# Patient Record
Sex: Female | Born: 1975 | Race: White | Hispanic: No | Marital: Married
Health system: Southern US, Community
[De-identification: ages and names within clinical notes are randomized; demographics above are authoritative.]

## PROBLEM LIST (undated history)

## (undated) DIAGNOSIS — I2699 Other pulmonary embolism without acute cor pulmonale: Secondary | ICD-10-CM

## (undated) DIAGNOSIS — F339 Major depressive disorder, recurrent, unspecified: Secondary | ICD-10-CM

## (undated) DIAGNOSIS — D6859 Other primary thrombophilia: Secondary | ICD-10-CM

## (undated) DIAGNOSIS — I639 Cerebral infarction, unspecified: Secondary | ICD-10-CM

## (undated) DIAGNOSIS — F419 Anxiety disorder, unspecified: Secondary | ICD-10-CM

---

## 2016-05-28 DIAGNOSIS — I639 Cerebral infarction, unspecified: Secondary | ICD-10-CM

## 2016-05-28 HISTORY — DX: Cerebral infarction, unspecified: I63.9

## 2017-08-09 ENCOUNTER — Encounter (HOSPITAL_COMMUNITY): Payer: Self-pay | Admitting: Emergency Medicine

## 2017-08-09 ENCOUNTER — Emergency Department (HOSPITAL_COMMUNITY): Payer: 59

## 2017-08-09 ENCOUNTER — Emergency Department (HOSPITAL_COMMUNITY)
Admission: EM | Admit: 2017-08-09 | Discharge: 2017-08-09 | Disposition: A | Discharge status: Home / Self Care | Payer: 59 | Attending: Emergency Medicine | Admitting: Emergency Medicine

## 2017-08-09 DIAGNOSIS — D6859 Other primary thrombophilia: Secondary | ICD-10-CM | POA: Insufficient documentation

## 2017-08-09 DIAGNOSIS — Z7901 Long term (current) use of anticoagulants: Secondary | ICD-10-CM | POA: Diagnosis not present

## 2017-08-09 DIAGNOSIS — Z8673 Personal history of transient ischemic attack (TIA), and cerebral infarction without residual deficits: Secondary | ICD-10-CM | POA: Diagnosis not present

## 2017-08-09 DIAGNOSIS — Z79899 Other long term (current) drug therapy: Secondary | ICD-10-CM | POA: Diagnosis not present

## 2017-08-09 DIAGNOSIS — R531 Weakness: Secondary | ICD-10-CM | POA: Insufficient documentation

## 2017-08-09 DIAGNOSIS — F339 Major depressive disorder, recurrent, unspecified: Secondary | ICD-10-CM | POA: Insufficient documentation

## 2017-08-09 DIAGNOSIS — Z86711 Personal history of pulmonary embolism: Secondary | ICD-10-CM | POA: Diagnosis not present

## 2017-08-09 DIAGNOSIS — F419 Anxiety disorder, unspecified: Secondary | ICD-10-CM | POA: Insufficient documentation

## 2017-08-09 DIAGNOSIS — R202 Paresthesia of skin: Secondary | ICD-10-CM | POA: Diagnosis not present

## 2017-08-09 DIAGNOSIS — I2699 Other pulmonary embolism without acute cor pulmonale: Secondary | ICD-10-CM | POA: Insufficient documentation

## 2017-08-09 HISTORY — DX: Anxiety disorder, unspecified: F41.9

## 2017-08-09 HISTORY — DX: Other primary thrombophilia: D68.59

## 2017-08-09 HISTORY — DX: Other pulmonary embolism without acute cor pulmonale: I26.99

## 2017-08-09 HISTORY — DX: Cerebral infarction, unspecified: I63.9

## 2017-08-09 HISTORY — DX: Major depressive disorder, recurrent, unspecified: F33.9

## 2017-08-09 LAB — I-STAT CHEM 8, ED
BUN: 5 mg/dL — ABNORMAL LOW (ref 6–20)
Calcium, Ion: 1.17 mmol/L (ref 1.15–1.40)
Chloride: 106 mmol/L (ref 101–111)
Creatinine, Ser: 0.7 mg/dL (ref 0.44–1.00)
Glucose, Bld: 112 mg/dL — ABNORMAL HIGH (ref 65–99)
HCT: 44 % (ref 36.0–46.0)
Hemoglobin: 15 g/dL (ref 12.0–15.0)
Potassium: 3.8 mmol/L (ref 3.5–5.1)
Sodium: 142 mmol/L (ref 135–145)
TCO2: 23 mmol/L (ref 22–32)

## 2017-08-09 LAB — RAPID URINE DRUG SCREEN, HOSP PERFORMED
Amphetamines: NOT DETECTED
Barbiturates: NOT DETECTED
Benzodiazepines: POSITIVE — AB
Cocaine: NOT DETECTED
Opiates: NOT DETECTED
Tetrahydrocannabinol: NOT DETECTED

## 2017-08-09 LAB — APTT: aPTT: 30 seconds (ref 24–36)

## 2017-08-09 LAB — URINALYSIS, ROUTINE W REFLEX MICROSCOPIC
Bilirubin Urine: NEGATIVE
Glucose, UA: NEGATIVE mg/dL
Hgb urine dipstick: NEGATIVE
Ketones, ur: NEGATIVE mg/dL
Leukocytes, UA: NEGATIVE
Nitrite: NEGATIVE
Protein, ur: NEGATIVE mg/dL
Specific Gravity, Urine: 1.005 (ref 1.005–1.030)
pH: 7 (ref 5.0–8.0)

## 2017-08-09 LAB — DIFFERENTIAL
Basophils Absolute: 0 10*3/uL (ref 0.0–0.1)
Basophils Relative: 0 %
Eosinophils Absolute: 0 10*3/uL (ref 0.0–0.7)
Eosinophils Relative: 1 %
Lymphocytes Relative: 39 %
Lymphs Abs: 2.3 10*3/uL (ref 0.7–4.0)
Monocytes Absolute: 0.3 10*3/uL (ref 0.1–1.0)
Monocytes Relative: 4 %
Neutro Abs: 3.3 10*3/uL (ref 1.7–7.7)
Neutrophils Relative %: 56 %

## 2017-08-09 LAB — PROTIME-INR
INR: 0.97
Prothrombin Time: 12.8 seconds (ref 11.4–15.2)

## 2017-08-09 LAB — COMPREHENSIVE METABOLIC PANEL
ALT: 22 U/L (ref 14–54)
AST: 26 U/L (ref 15–41)
Albumin: 4.2 g/dL (ref 3.5–5.0)
Alkaline Phosphatase: 43 U/L (ref 38–126)
Anion gap: 10 (ref 5–15)
BUN: 5 mg/dL — ABNORMAL LOW (ref 6–20)
CO2: 21 mmol/L — ABNORMAL LOW (ref 22–32)
Calcium: 9.1 mg/dL (ref 8.9–10.3)
Chloride: 108 mmol/L (ref 101–111)
Creatinine, Ser: 0.78 mg/dL (ref 0.44–1.00)
GFR calc Af Amer: >60 mL/min (ref >60)
GFR calc non Af Amer: >60 mL/min (ref >60)
Glucose, Bld: 112 mg/dL — ABNORMAL HIGH (ref 65–99)
Potassium: 3.9 mmol/L (ref 3.5–5.1)
Sodium: 139 mmol/L (ref 135–145)
Total Bilirubin: 0.6 mg/dL (ref 0.3–1.2)
Total Protein: 7.3 g/dL (ref 6.5–8.1)

## 2017-08-09 LAB — CBC
HCT: 43.8 % (ref 36.0–46.0)
Hemoglobin: 14.5 g/dL (ref 12.0–15.0)
MCH: 31 pg (ref 26.0–34.0)
MCHC: 33.1 g/dL (ref 30.0–36.0)
MCV: 93.6 fL (ref 78.0–100.0)
Platelets: 325 10*3/uL (ref 150–400)
RBC: 4.68 MIL/uL (ref 3.87–5.11)
RDW: 13.1 % (ref 11.5–15.5)
WBC: 5.9 10*3/uL (ref 4.0–10.5)

## 2017-08-09 LAB — I-STAT TROPONIN, ED: Troponin i, poc: 0 ng/mL (ref 0.00–0.08)

## 2017-08-09 LAB — I-STAT BETA HCG BLOOD, ED (MC, WL, AP ONLY): I-stat hCG, quantitative: <5 m[IU]/mL (ref <5)

## 2017-08-09 LAB — ETHANOL: Alcohol, Ethyl (B): <10 mg/dL (ref <10)

## 2017-08-09 MED ORDER — IOPAMIDOL (ISOVUE-370) INJECTION 76%
50.0000 mL | Freq: Once | INTRAVENOUS | Status: AC | PRN
  Administered 2017-08-09: 50 mL via INTRAVENOUS

## 2017-08-09 MED ORDER — IOPAMIDOL (ISOVUE-370) INJECTION 76%
INTRAVENOUS | Status: AC
  Filled 2017-08-09: qty 50

## 2017-08-09 MED ORDER — KETOROLAC TROMETHAMINE 30 MG/ML IJ SOLN: 30.0000 mg | Freq: Once | INTRAMUSCULAR | Status: DC

## 2017-08-09 MED ORDER — METOCLOPRAMIDE HCL 5 MG/ML IJ SOLN: 10.0000 mg | Freq: Once | INTRAMUSCULAR | Status: DC

## 2017-08-09 MED ORDER — LORAZEPAM 2 MG/ML IJ SOLN
1.0000 mg | Freq: Once | INTRAMUSCULAR | Status: AC
  Administered 2017-08-09: 1 mg via INTRAVENOUS
  Filled 2017-08-09: qty 1

## 2017-08-09 MED ORDER — IOPAMIDOL (ISOVUE-370) INJECTION 76%: 50.0000 mL | Freq: Once | INTRAVENOUS | Status: DC | PRN

## 2017-08-09 NOTE — ED Provider Notes (Signed)
[facility]GENCY [facility] Provider Note   CSN: [id number] Arrival date & time: 08/09/17  0907     History   Chief Complaint Chief Complaint  Patient presents with  . Weakness    HPI Virginia Foster is a 42 y.o. female with history of CVA, left carotid stenosis, protein S deficiency, anxiety who presents with a one-week history of generalized weakness and a 1 day history of tingling in her right hand and right foot.  She is also had fasciculations in her fingers on her right hand.  1800 yesterday.  The numbness started about the fasciculations started about an hour ago in route to the hospital.  Patient is visiting from out of town and plans to fly out for vacation this afternoon.  Was treated for her stroke in [place] in February 2018.  Patient takes Eliquis twice daily.  She also takes 2 mg Xanax 3 times daily for her anxiety.  Patient denies any headache.  Patient has had some blurry vision, however states she recently had a tear duct procedure which improved the blurry vision, however it has returned.  She reports she had blurry vision from her stroke before.  She has history of left endarterectomy.  Patient denies any chest pain, shortness of breath, abdominal pain, nausea, vomiting, urinary symptoms.  HPI  Past Medical History:  Diagnosis Date  . Anxiety   . CVA (cerebral vascular accident) (HCC) 05/2016  . Protein S deficiency (HCC)   . Pulmonary embolism (HCC)   . Recurrent major depression [facility](HCC)     Patient Active Problem List   Diagnosis Date Noted  . Anxiety   . Protein S deficiency (HCC)   . Pulmonary embolism (HCC)   . Recurrent major depression (HCC)   . CVA (cerebral vascular accident) (HCC) 05/28/2016     OB History   None      Home Medications    Prior to Admission medications   Medication Sig Start Date End Date Taking? Authorizing Provider  alprazolam Prudy Feeler(XANAX) 2 MG tablet Take 2 mg by mouth 3 (three) times daily. 07/04/17  Yes  [provider]  aspirin 81 MG tablet Take 81 mg by mouth daily.   Yes [provider]  atorvastatin (LIPITOR) 40 MG tablet Take 40 mg by mouth daily. 07/26/17  Yes [provider]  citalopram (CELEXA) 40 MG tablet Take 40 mg by mouth daily. 08/10/16  Yes [provider]  docusate sodium (COLACE) 100 MG capsule Take 200 mg by mouth 2 (two) times daily.   Yes [provider]  ELIQUIS 5 MG TABS tablet Take 5 mg by mouth 2 (two) times daily. 07/26/17  Yes [provider]  hydroxypropyl methylcellulose / hypromellose (ISOPTO TEARS / GONIOVISC) 2.5 % ophthalmic solution Place 1 drop into both eyes as needed for dry eyes (PF).   Yes [provider]  NON FORMULARY Place 1 application rectally 2 (two) times daily. Lidocaine5%/ Nifedipine 0.2% in petroleum 07/20/17  Yes [provider]  polyethylene glycol (MIRALAX / GLYCOLAX) packet Take 17 g by mouth daily.   Yes [provider]  ranitidine (ZANTAC) 150 MG tablet Take 150 mg by mouth as needed for heartburn.   Yes [provider]    Family History No family history on file.  Social History Social History   Tobacco Use  . Smoking status: Not on file  Substance Use Topics  . Alcohol use: Not on file  . Drug use: Not on file  Allergies   Patient has no allergy information on record.   Review of Systems Review of Systems  Constitutional: Negative for chills and fever.  HENT: Negative for facial swelling and sore throat.   Respiratory: Negative for shortness of breath.   Cardiovascular: Negative for chest pain.  Gastrointestinal: Negative for abdominal pain, nausea and vomiting.  Genitourinary: Negative for dysuria.  Musculoskeletal: Negative for back pain.  Skin: Negative for rash and wound.  Neurological: Positive for dizziness ("swimmy headed"), tremors (R fingers), weakness (generalized) and numbness. Negative for speech difficulty and headaches.    Psychiatric/Behavioral: The patient is not nervous/anxious.      Physical Exam Updated Vital Signs BP 106/63   Pulse 77   Temp 98 F (36.7 C) (Oral)   Resp 19   SpO2 98%   Physical Exam  Constitutional: She appears well-developed and well-nourished. No distress.  HENT:  Head: Normocephalic and atraumatic.  Mouth/Throat: Oropharynx is clear and moist. No oropharyngeal exudate.  Eyes: Pupils are equal, round, and reactive to light. Conjunctivae are normal. Right eye exhibits no discharge. Left eye exhibits no discharge. No scleral icterus.  Neck: Normal range of motion. Neck supple. No thyromegaly present.  Cardiovascular: Normal rate, regular rhythm, normal heart sounds and intact distal pulses. Exam reveals no gallop and no friction rub.  No murmur heard. Pulmonary/Chest: Effort normal and breath sounds normal. No stridor. No respiratory distress. She has no wheezes. She has no rales.  Abdominal: Soft. Bowel sounds are normal. She exhibits no distension. There is no tenderness. There is no rebound and no guarding.  Musculoskeletal: She exhibits no edema.  Lymphadenopathy:    She has no cervical adenopathy.  Neurological: She is alert. Coordination normal.  CN 3-12 intact; normal sensation throughout, paresthesias on the fourth and fifth digits; fasciculations noted to 2-5 digits; 5/5 strength in all 4 extremities; equal bilateral grip strength; no ataxia on finger to nose   Skin: Skin is warm and dry. No rash noted. She is not diaphoretic. No pallor.  Psychiatric: She has a normal mood and affect.  Nursing note and vitals reviewed.    ED Treatments / Results  Labs (all labs ordered are listed, but only abnormal results are displayed) Labs Reviewed  COMPREHENSIVE METABOLIC PANEL - Abnormal; Notable for the following components:      Result Value   CO2 21 (*)    Glucose, Bld 112 (*)    BUN 5 (*)    All other components within normal limits  RAPID URINE DRUG SCREEN, HOSP  PERFORMED - Abnormal; Notable for the following components:   Benzodiazepines POSITIVE (*)    All other components within normal limits  URINALYSIS, ROUTINE W REFLEX MICROSCOPIC - Abnormal; Notable for the following components:   Color, Urine STRAW (*)    All other components within normal limits  I-STAT CHEM 8, ED - Abnormal; Notable for the following components:   BUN 5 (*)    Glucose, Bld 112 (*)    All other components within normal limits  ETHANOL  PROTIME-INR  APTT  CBC  DIFFERENTIAL  I-STAT TROPONIN, ED  I-STAT BETA HCG BLOOD, ED ([facility], [facility], [facility] ONLY)    EKG EKG Interpretation  Date/Time:  Monday August 09 2017 10:40:49 EDT Ventricular Rate:  75 PR Interval:    QRS Duration: 85 QT Interval:  401 QTC Calculation: 448 R Axis:   68 Text Interpretation:  Sinus rhythm No previous tracing Confirmed by Cathren Laine ([id number]) on 08/09/2017 10:56:53 AM Also confirmed  by Cathren Laine ([id number]), editor Sheppard Evens [phone])  on 08/09/2017 2:30:06 PM   Radiology Ct Angio Head W Or Wo Contrast  Result Date: 08/09/2017 CLINICAL DATA:  42 year old female with weakness and tingling of right upper and lower extremities since 1800 hours yesterday. Dizziness. Patient reports prior stroke and is on Eliquis. EXAM: CT ANGIOGRAPHY HEAD AND NECK TECHNIQUE: Multidetector CT imaging of the head and neck was performed using the standard protocol during bolus administration of intravenous contrast. Multiplanar CT image reconstructions and MIPs were obtained to evaluate the vascular anatomy. Carotid stenosis measurements (when applicable) are obtained utilizing NASCET criteria, using the distal internal carotid diameter as the denominator. CONTRAST:  50mL ISOVUE-370 IOPAMIDOL (ISOVUE-370) INJECTION 76% COMPARISON:  None. FINDINGS: CT HEAD Brain: Cerebral volume is within normal limits. There is a small area of suspected cortical and subcortical white matter encephalomalacia in the left superior peri rolandic  region on series 13, image 22. Questionable additional abnormal hypodensity or encephalomalacia in the anterior right temporal lobe (sagittal image 45). No other encephalomalacia identified. And elsewhere gray-white matter differentiation appears normal. No midline shift, ventriculomegaly, mass effect, evidence of mass lesion, intracranial hemorrhage or evidence of cortically based acute infarction. Calvarium and skull base: Intact. Paranasal sinuses: Clear.  Tympanic cavities and mastoids are clear. Orbits: Visualized orbits and scalp soft tissues are within normal limits. CTA NECK Skeleton: Negative.  No acute osseous abnormality identified. Upper chest: Normal visible upper lungs. No superior mediastinal lymphadenopathy. Other neck: Negative.  No neck mass or lymphadenopathy. Aortic arch: 3 vessel arch configuration. No arch atherosclerosis or great vessel origin stenosis. Right carotid system: Normal right CCA. The right carotid bifurcation and cervical right ICA appear normal (series 12, image 17). Left carotid system: Abnormal mild soft tissue thickening which appears circumferential in the lower left CCA (series 5, image 126) and is associated with mild luminal irregularity (series 9, image 126) which continues to the left carotid bifurcation. The distal left CCA lumen is capacious and irregular, but without stenosis (series 9, image 131). The left ICA origin and bulb are patent with only mild irregularity, and the circumferential soft tissue seems to abate distal to the bulb. The cervical left ICA caliber is normal to perhaps slightly decreased. There is no stenosis to the skull base. Vertebral arteries: Normal proximal right subclavian artery and right vertebral artery origin. The right vertebral artery is mildly non dominant and appears normal to the skull base. Normal proximal left subclavian artery and left vertebral artery origin. The left vertebral artery is dominant and appears normal to the skull  base. CTA HEAD Posterior circulation: Normal right PICA origin and the non dominant right vertebral artery continues to the vertebrobasilar junction. No distal left vertebral artery stenosis. The left AICA may be dominant. The vertebrobasilar junction, basilar artery, AICA origins and SCA origins appear normal. Normal PCA origins with diminutive or absent posterior communicating arteries. Bilateral PCA branches appear mildly irregular (right PCA P2 segment series 12, image 21). The PCA branches are patent. Anterior circulation: Both ICA siphons are patent. The right siphon appears normal, with normal right ophthalmic artery origin. The left siphon also appears normal. Normal left ophthalmic artery origin. Normal carotid termini, MCA and ACA origins. The anterior communicating artery is diminutive. The bilateral ACA branches are within normal limits. There is questionable mild irregularity of both MCA M1 segments (series 11, image 17). No left MCA M1 or left MCA bifurcation stenosis. The left MCA bifurcation has a slightly unusual configuration (series 10,  image 19), but the left MCA branches are patent without stenosis. There is perhaps mild left MCA branch irregularity (series 12, image 31). The right MCA M1 segment, bifurcation, and right MCA branches are patent without stenosis. Questionable mild right MCA branch irregularity. Venous sinuses: Patent. Anatomic variants: Dominant left vertebral artery. Delayed phase: No abnormal enhancement identified. Review of the MIP images confirms the above findings IMPRESSION: 1. Negative for emergent large vessel occlusion. No significant arterial stenosis in the head or neck. 2. Positive for abnormal appearance of the left CCA and proximal ICA, which demonstrate circumferential soft tissue thickening and luminal irregularity but no stenosis. See series 12, image 29. The contralateral right carotid, arch, and other great vessels appear spared. But there is mild irregularity  suspected in the proximal PCAs, and perhaps also the bilateral MCAs. 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 The appearance is not typical of FMD or atherosclerosis. Prior surgery or perhaps a healed prior arterial injury or dissection might have this appearance. 3. Small chronic area of encephalomalacia in the left superior peri rolandic cortex and white matter suspected. Possible encephalomalacia in the anterior right temporal lobe. 4. Otherwise negative CT appearance of the brain. Electronically Signed   By: Odessa Fleming M.D.   On: 08/09/2017 15:13   Ct Angio Neck W And/or Wo Contrast  Result Date: 08/09/2017 CLINICAL DATA:  42 year old female with weakness and tingling of right upper and lower extremities since 1800 hours yesterday. Dizziness. Patient reports prior stroke and is on Eliquis. EXAM: CT ANGIOGRAPHY HEAD AND NECK TECHNIQUE: Multidetector CT imaging of the head and neck was performed using the standard protocol during bolus administration of intravenous contrast. Multiplanar CT image reconstructions and MIPs were obtained to evaluate the vascular anatomy. Carotid stenosis measurements (when applicable) are obtained utilizing NASCET criteria, using the distal internal carotid diameter as the denominator. CONTRAST:  50mL ISOVUE-370 IOPAMIDOL (ISOVUE-370) INJECTION 76% COMPARISON:  None. FINDINGS: CT HEAD Brain: Cerebral volume is within normal limits. There is a small area of suspected cortical and subcortical white matter encephalomalacia in the left superior peri rolandic region on series 13, image 22. Questionable additional abnormal hypodensity or encephalomalacia in the anterior right temporal lobe (sagittal image 45). No other encephalomalacia identified. And elsewhere gray-white matter differentiation appears normal. No midline shift, ventriculomegaly, mass effect, evidence of mass lesion, intracranial hemorrhage or evidence of cortically based acute infarction. Calvarium and skull base:  Intact. Paranasal sinuses: Clear.  Tympanic cavities and mastoids are clear. Orbits: Visualized orbits and scalp soft tissues are within normal limits. CTA NECK Skeleton: Negative.  No acute osseous abnormality identified. Upper chest: Normal visible upper lungs. No superior mediastinal lymphadenopathy. Other neck: Negative.  No neck mass or lymphadenopathy. Aortic arch: 3 vessel arch configuration. No arch atherosclerosis or great vessel origin stenosis. Right carotid system: Normal right CCA. The right carotid bifurcation and cervical right ICA appear normal (series 12, image 17). Left carotid system: Abnormal mild soft tissue thickening which appears circumferential in the lower left CCA (series 5, image 126) and is associated with mild luminal irregularity (series 9, image 126) which continues to the left carotid bifurcation. The distal left CCA lumen is capacious and irregular, but without stenosis (series 9, image 131). The left ICA origin and bulb are patent with only mild irregularity, and the circumferential soft tissue seems to abate distal to the bulb. The cervical left ICA caliber is normal to perhaps slightly decreased. There is no stenosis to the skull base. Vertebral arteries: Normal proximal right  subclavian artery and right vertebral artery origin. The right vertebral artery is mildly non dominant and appears normal to the skull base. Normal proximal left subclavian artery and left vertebral artery origin. The left vertebral artery is dominant and appears normal to the skull base. CTA HEAD Posterior circulation: Normal right PICA origin and the non dominant right vertebral artery continues to the vertebrobasilar junction. No distal left vertebral artery stenosis. The left AICA may be dominant. The vertebrobasilar junction, basilar artery, AICA origins and SCA origins appear normal. Normal PCA origins with diminutive or absent posterior communicating arteries. Bilateral PCA branches appear mildly  irregular (right PCA P2 segment series 12, image 21). The PCA branches are patent. Anterior circulation: Both ICA siphons are patent. The right siphon appears normal, with normal right ophthalmic artery origin. The left siphon also appears normal. Normal left ophthalmic artery origin. Normal carotid termini, MCA and ACA origins. The anterior communicating artery is diminutive. The bilateral ACA branches are within normal limits. There is questionable mild irregularity of both MCA M1 segments (series 11, image 17). No left MCA M1 or left MCA bifurcation stenosis. The left MCA bifurcation has a slightly unusual configuration (series 10, image 19), but the left MCA branches are patent without stenosis. There is perhaps mild left MCA branch irregularity (series 12, image 31). The right MCA M1 segment, bifurcation, and right MCA branches are patent without stenosis. Questionable mild right MCA branch irregularity. Venous sinuses: Patent. Anatomic variants: Dominant left vertebral artery. Delayed phase: No abnormal enhancement identified. Review of the MIP images confirms the above findings IMPRESSION: 1. Negative for emergent large vessel occlusion. No significant arterial stenosis in the head or neck. 2. Positive for abnormal appearance of the left CCA and proximal ICA, which demonstrate circumferential soft tissue thickening and luminal irregularity but no stenosis. See series 12, image 29. The contralateral right carotid, arch, and other great vessels appear spared. But there is mild irregularity suspected in the proximal PCAs, and perhaps also the bilateral MCAs. 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 The appearance is not typical of FMD or atherosclerosis. Prior surgery or perhaps a healed prior arterial injury or dissection might have this appearance. 3. Small chronic area of encephalomalacia in the left superior peri rolandic cortex and white matter suspected. Possible encephalomalacia in the anterior  right temporal lobe. 4. Otherwise negative CT appearance of the brain. Electronically Signed   By: Odessa Fleming M.D.   On: 08/09/2017 15:13   Mr Brain Wo Contrast  Result Date: 08/09/2017 CLINICAL DATA:  Generalized weakness and tingling involving the right arm and leg beginning yesterday. EXAM: MRI HEAD WITHOUT CONTRAST TECHNIQUE: Multiplanar, multiecho pulse sequences of the brain and surrounding structures were obtained without intravenous contrast. COMPARISON:  Head CT and CTA 08/09/2017 FINDINGS: Brain: There is no evidence of acute infarct, intracranial hemorrhage, mass, midline shift, or extra-axial fluid collection. There is a small chronic left parietal lobe cortical infarct. The brain is normal in signal elsewhere. Vascular: Major intracranial vascular flow voids are preserved. Skull and upper cervical spine: Unremarkable bone marrow signal. Sinuses/Orbits: Unremarkable orbits. Paranasal sinuses and mastoid air cells are clear. Other: None. IMPRESSION: 1. No acute intracranial abnormality. 2. Small chronic left parietal infarct. Electronically Signed   By: Sebastian Ache M.D.   On: 08/09/2017 15:48    Procedures Procedures (including critical care time)  Medications Ordered in ED Medications  iopamidol (ISOVUE-370) 76 % injection (has no administration in time range)  iopamidol (ISOVUE-370) 76 % injection 50 mL (has  no administration in time range)  LORazepam (ATIVAN) injection 1 mg (1 mg Intravenous Given 08/09/17 1355)  iopamidol (ISOVUE-370) 76 % injection 50 mL (50 mLs Intravenous Contrast Given 08/09/17 1450)     Initial Impression / Assessment and Plan / ED Course  I have reviewed the triage vital signs and the nursing notes.  Pertinent labs & imaging results that were available during my care of the patient were reviewed by me and considered in my medical decision making (see chart for details).  Clinical Course as of Aug 09 1720  Mon Aug 09, 2017  1110 I spoke with Dr. Wilford Corner with  neurology regarding the patient and he advises MR of the brain w/o contrast and CTA head and neck.   [AL]    Clinical Course User Index [AL] Emi Holes, PA-C    Patient with tingling to her fingers and fasciculations in her hand.  Labs are unremarkable.  CTA head and neck and MRI of the brain are negative for acute findings.  I discussed these with Dr. Wilford Corner, neurologist, who advised outpatient follow-up with neurology.  Patient will be discharged home with these instructions upon return home to [place], [place].  Patient reports the tingling and other symptoms have resolved, however she is still having some twitching in her hand.  Suspect there could definitely be an anxiety component to this, as patient is traveling.  Return precautions discussed.  Patient understands and agrees with plan.  Patient vitals stable throughout ED course and discharged in satisfactory condition.  Final Clinical Impressions(s) / ED Diagnoses   Final diagnoses:  Weakness  Paresthesias in right hand    ED Discharge Orders    None       Emi Holes, PA-C 08/09/17 1722    Cathren Laine, MD 08/11/17 1245

## 2017-08-09 NOTE — ED Notes (Signed)
Patient transported to CT 

## 2017-08-09 NOTE — Discharge Instructions (Addendum)
Please follow-up with a neurologist after returning home for further evaluation and treatment of your symptoms.  All of your lab work was within normal limits today.  Your CT and MRI showed postsurgical changes and changes from her old stroke only.  Please go to the  closest emergency department if you develop any new or worsening symptoms.

## 2017-08-09 NOTE — ED Notes (Signed)
Pt ambulated to restroom with steady gait.

## 2017-08-09 NOTE — ED Notes (Signed)
Pt and husband in a hurry to get to airport for a trip.

## 2017-08-09 NOTE — ED Triage Notes (Signed)
Pt arrives via EMS with complaints of generalized weakness and tingling of R arm and leg since 1800 yesterday. Pt denies migraine. Pt reports hx of anxiety, PE, clotting disorder, and CVA. Pt takes eliquis. Endorses taking 2 mg xanax at 8 am.

## 2019-07-09 IMAGING — CT CT ANGIO NECK
1 of 12 series · 5 of 33 positions shown · IV contrast (OMNI 350)
Comparison: None.

CLINICAL DATA: 42-year-old female with weakness and tingling of
right upper and lower extremities since 3611 hours yesterday.
Dizziness. Patient reports prior stroke and is on Eliquis.

EXAM:
CT ANGIOGRAPHY HEAD AND NECK
TECHNIQUE: Multidetector CT imaging of the head and neck was performed using
the standard protocol during bolus administration of intravenous
contrast. Multiplanar CT image reconstructions and MIPs were
obtained to evaluate the vascular anatomy. Carotid stenosis
measurements (when applicable) are obtained utilizing NASCET
criteria, using the distal internal carotid diameter as the
denominator.
CONTRAST:  50mL [id number] IOPAMIDOL (J19G6Z-DXO) INJECTION 76%

[Series 7: cta neck axial · axial · 0.39mm/px · z∈[-270,-37]mm · 5 of 351 slices shown]
[im 59/351  soft-tissue]
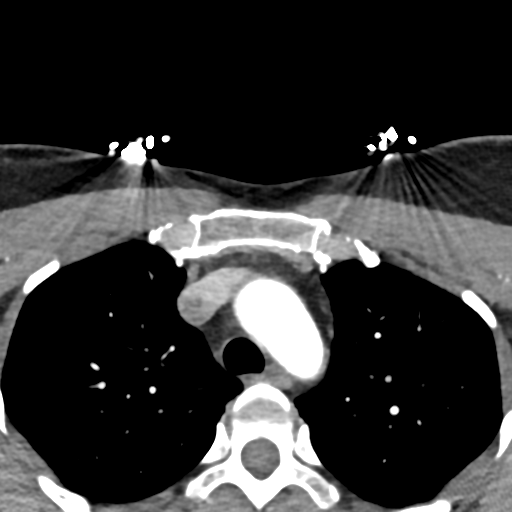
[im 117/351  bone]
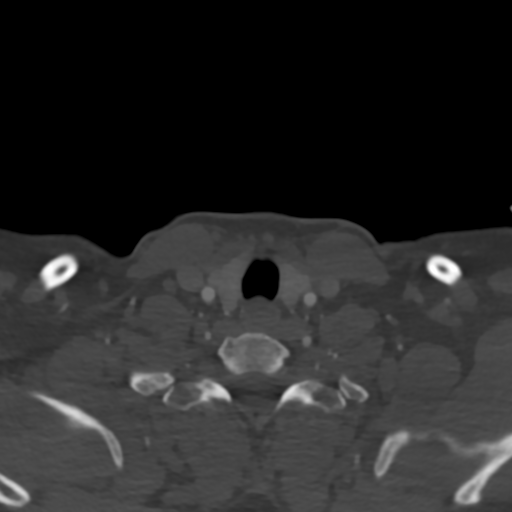
[im 176/351  soft-tissue]
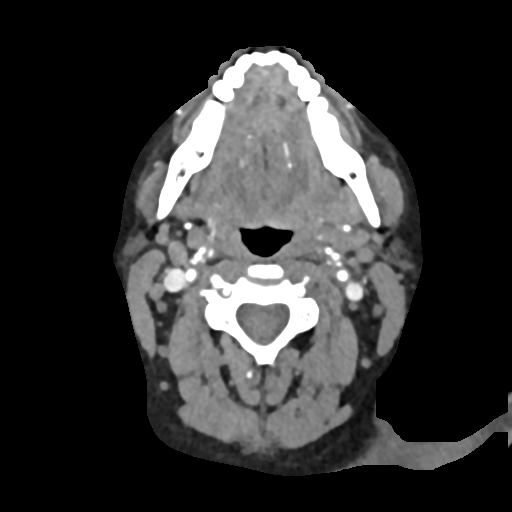
[im 234/351  bone]
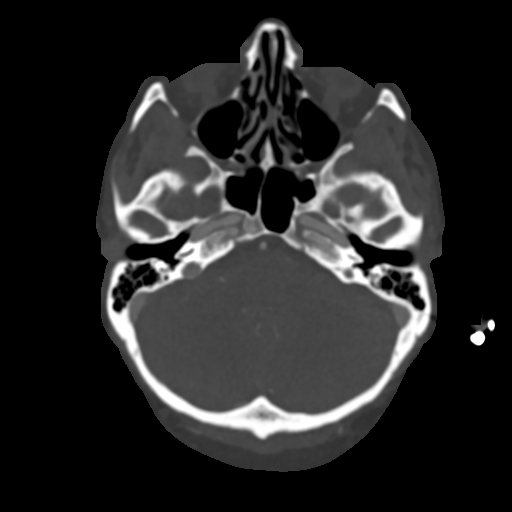
[im 292/351  soft-tissue]
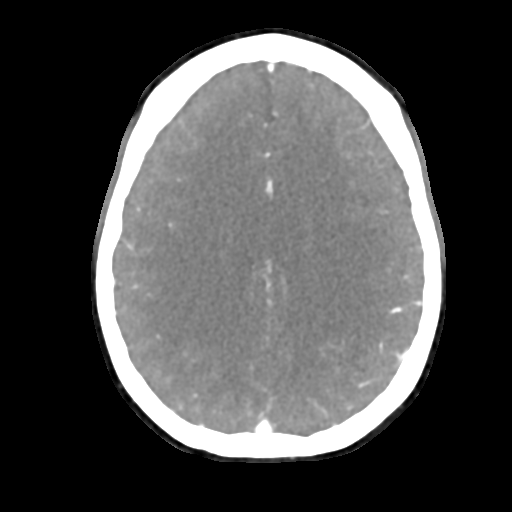

[5 of 33 positions shown; findings below may reference images not displayed]

FINDINGS: CT HEAD

Brain: Cerebral volume is within normal limits. There is a small
area of suspected cortical and subcortical white matter
encephalomalacia in the left superior peri rolandic region on series
13, image 22. Questionable additional abnormal hypodensity or
encephalomalacia in the anterior right temporal lobe (sagittal image
45). No other encephalomalacia identified. And elsewhere gray-white
matter differentiation appears normal. No midline shift,
ventriculomegaly, mass effect, evidence of mass lesion, intracranial
hemorrhage or evidence of cortically based acute infarction.

Calvarium and skull base: Intact.

Paranasal sinuses: Clear.  Tympanic cavities and mastoids are clear.

Orbits: Visualized orbits and scalp soft tissues are within normal
limits.

CTA NECK

Skeleton: Negative.  No acute osseous abnormality identified.

Upper chest: Normal visible upper lungs. No superior mediastinal
lymphadenopathy.

Other neck: Negative.  No neck mass or lymphadenopathy.

Aortic arch: 3 vessel arch configuration. No arch atherosclerosis or
great vessel origin stenosis.

Right carotid system: Normal right CCA. The right carotid
bifurcation and cervical right ICA appear normal (series 12, image
17).

Left carotid system: Abnormal mild soft tissue thickening which
appears circumferential in the lower left CCA (series 5, image 126)
and is associated with mild luminal irregularity (series 9, image
126) which continues to the left carotid bifurcation. The distal
left CCA lumen is capacious and irregular, but without stenosis
(series 9, image 131). The left ICA origin and bulb are patent with
only mild irregularity, and the circumferential soft tissue seems to
Able distal to the bulb. The cervical left ICA caliber is normal to
perhaps slightly decreased. There is no stenosis to the skull base.

Vertebral arteries:
Normal proximal right subclavian artery and right vertebral artery
origin. The right vertebral artery is mildly non dominant and
appears normal to the skull base.

Normal proximal left subclavian artery and left vertebral artery
origin. The left vertebral artery is dominant and appears normal to
the skull base.

CTA HEAD

Posterior circulation: Normal right PICA origin and the non dominant
right vertebral artery continues to the vertebrobasilar junction. No
distal left vertebral artery stenosis. The left AICA may be
dominant. The vertebrobasilar junction, basilar artery, AICA origins
and SCA origins appear normal. Normal PCA origins with diminutive or
absent posterior communicating arteries. Bilateral PCA branches
appear mildly irregular (right PCA P2 segment series 12, image 21).
The PCA branches are patent.

Anterior circulation: Both ICA siphons are patent. The right siphon
appears normal, with normal right ophthalmic artery origin. The left
siphon also appears normal. Normal left ophthalmic artery origin.
Normal carotid termini, MCA and ACA origins. The anterior
communicating artery is diminutive. The bilateral ACA branches are
within normal limits.

There is questionable mild irregularity of both MCA M1 segments
(series 11, image 17). No left MCA M1 or left MCA bifurcation
stenosis. The left MCA bifurcation has a slightly unusual
configuration (series 10, image 19), but the left MCA branches are
patent without stenosis. There is perhaps mild left MCA branch
irregularity (series 12, image 31). The right MCA M1 segment,
bifurcation, and right MCA branches are patent without stenosis.
Questionable mild right MCA branch irregularity.

Venous sinuses: Patent.

Anatomic variants: Dominant left vertebral artery.

Delayed phase: No abnormal enhancement identified.

Review of the MIP images confirms the above findings
IMPRESSION: 1. Negative for emergent large vessel occlusion. No significant
arterial stenosis in the head or neck.
2. Positive for abnormal appearance of the left CCA and proximal
ICA, which demonstrate circumferential soft tissue thickening and
luminal irregularity but no stenosis. See series 12, image 29.
The contralateral right carotid, arch, and other great vessels
appear spared. But there is mild irregularity suspected in the
proximal PCAs, and perhaps also the bilateral MCAs.
Consider a Medium-and-large Vessel Vasculitis. The appearance is not
typical of FMD or atherosclerosis. Prior surgery or perhaps a healed
prior arterial injury or dissection might have this appearance.
3. Small chronic area of encephalomalacia in the left superior peri
rolandic cortex and white matter suspected. Possible
encephalomalacia in the anterior right temporal lobe.
4. Otherwise negative CT appearance of the brain.

## 2019-07-09 IMAGING — MR MR HEAD W/O CM
8 of 10 series · 35 of 48 positions shown · non-contrast
Comparison: Head CT and CTA 08/09/2017

CLINICAL DATA: Generalized weakness and tingling involving the
right arm and leg beginning yesterday.

EXAM:
MRI HEAD WITHOUT CONTRAST
TECHNIQUE: Multiplanar, multiecho pulse sequences of the brain and surrounding
structures were obtained without intravenous contrast.

[Series 3: DWI · axial · 3.0mm · 1.09mm/px · z∈[-92,+39]mm · 9 of 90 slices shown (1 of 4)]
[im 1/90]
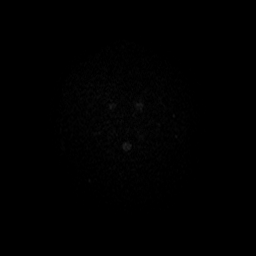
[im 12/90]
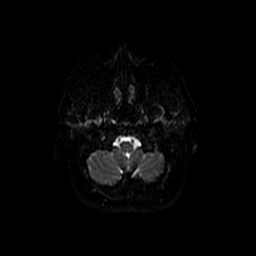
[im 23/90]
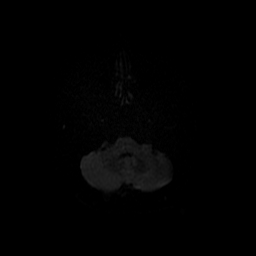
[im 34/90]
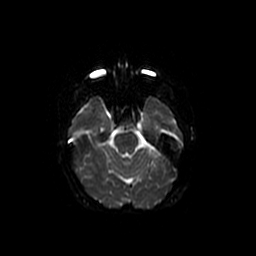
[im 45/90]
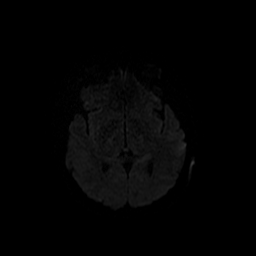
[im 56/90]
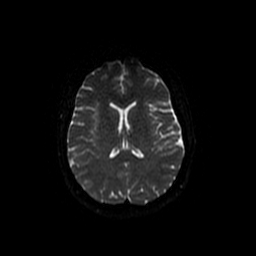
[im 67/90]
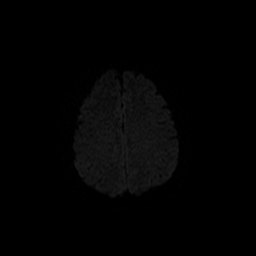
[im 78/90]
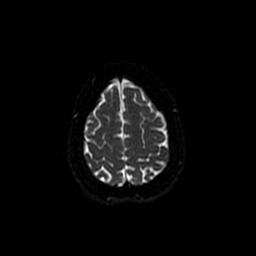
[im 90/90]
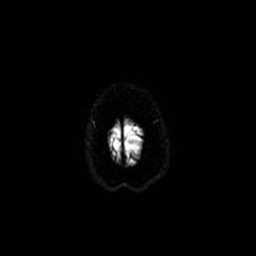

[Series 4: DWI · coronal · 5.0mm · 1.09mm/px · 6 of 60 slices shown (2 of 4)]
[im 1/60]
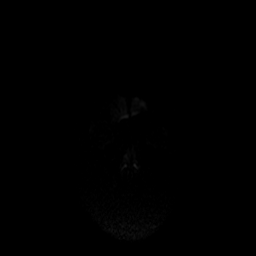
[im 12/60]
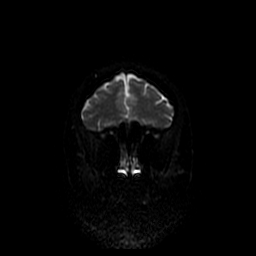
[im 24/60]
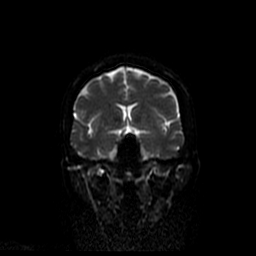
[im 36/60]
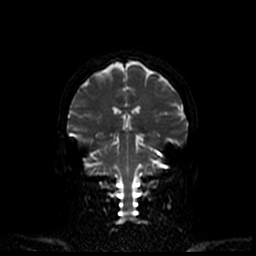
[im 48/60]
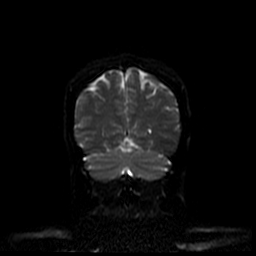
[im 60/60]
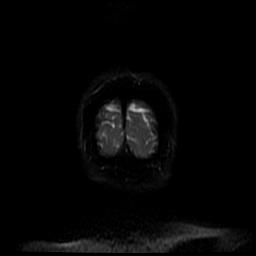

[Series 5: T1 · sagittal · 5.0mm · 0.47mm/px · 3 of 23 slices shown]
[im 1/23]
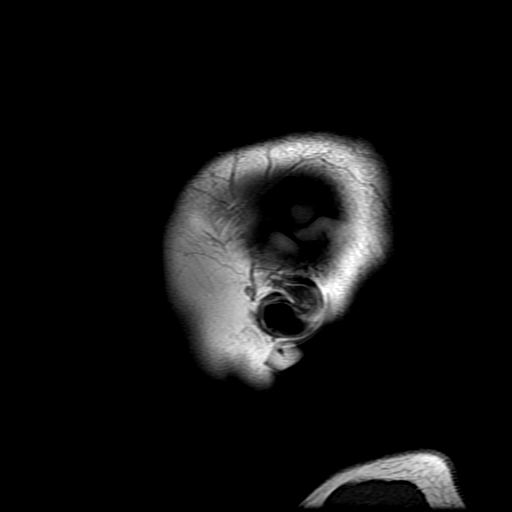
[im 12/23]
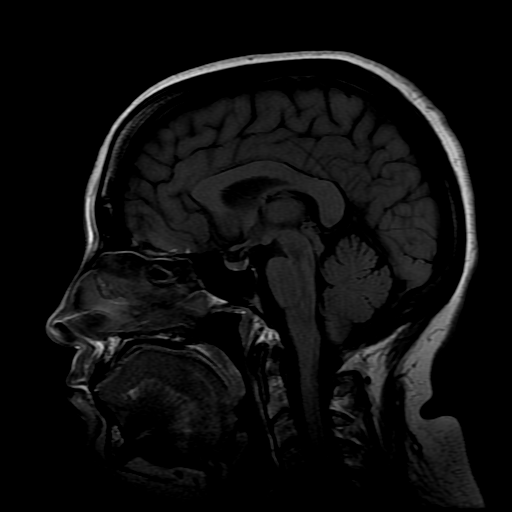
[im 23/23]
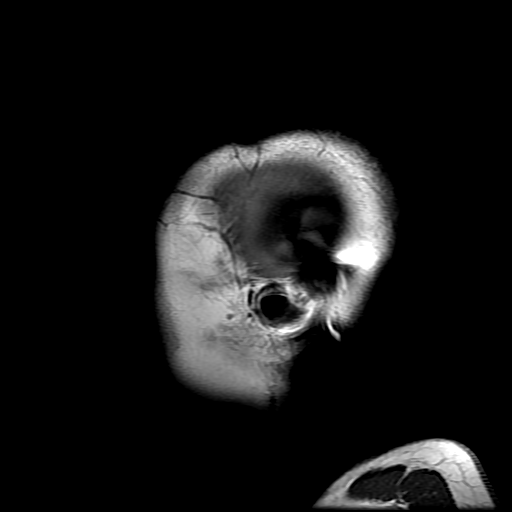

[Series 6: T2 · axial · 5.0mm · 0.43mm/px · z∈[-88,+43]mm · 3 of 23 slices shown (1 of 2)]
[im 1/23]
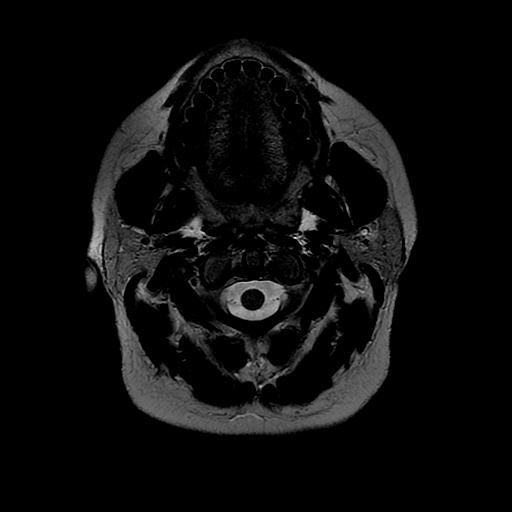
[im 12/23]
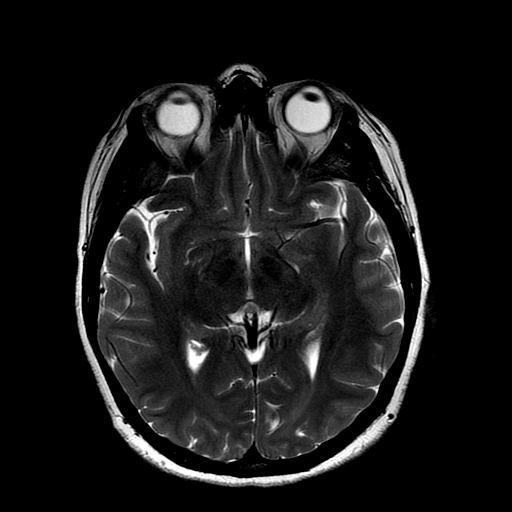
[im 23/23]
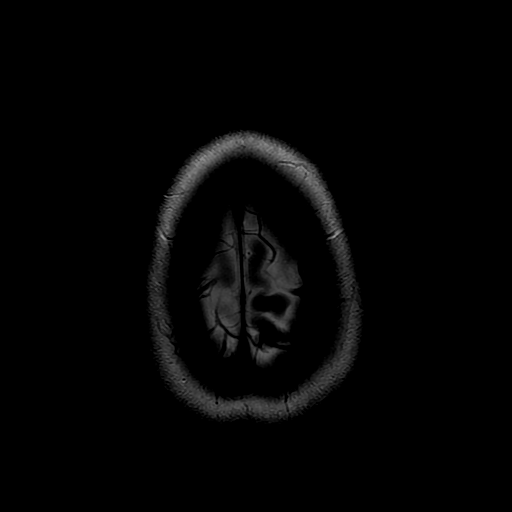

[Series 7: FLAIR · axial · 3.0mm · 0.43mm/px · z∈[-88,+43]mm · 3 of 23 slices shown]
[im 1/23]
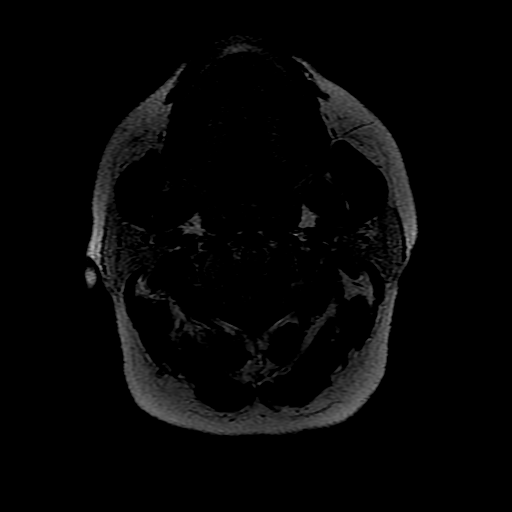
[im 12/23]
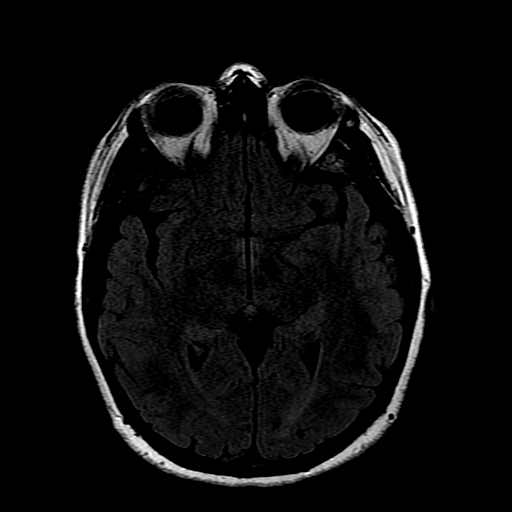
[im 23/23]
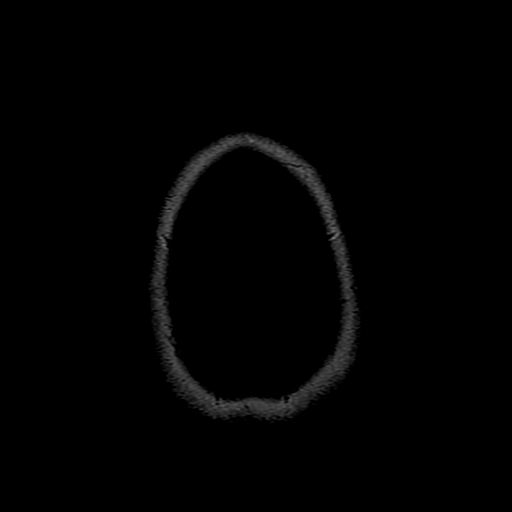

[Series 10: T2 · coronal · 5.0mm · 0.39mm/px · 3 of 25 slices shown (2 of 2)]
[im 1/25]
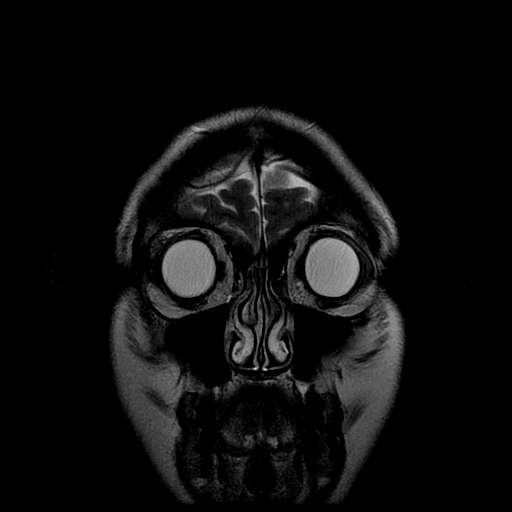
[im 13/25]
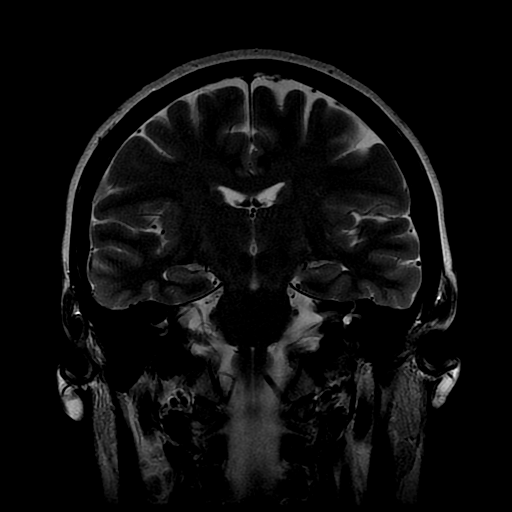
[im 25/25]
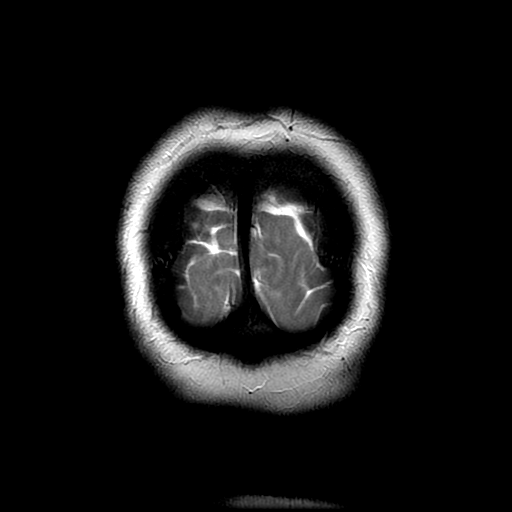

[Series 300: DWI · axial · 3.0mm · 1.09mm/px · z∈[-92,+39]mm · 5 of 45 slices shown (3 of 4)]
[im 1/45]
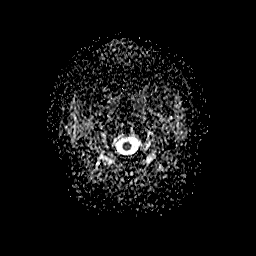
[im 12/45]
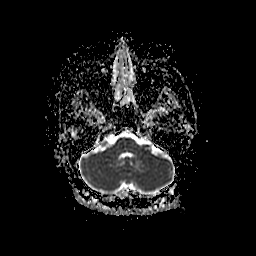
[im 23/45]
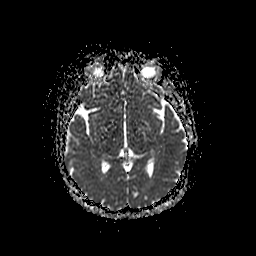
[im 34/45]
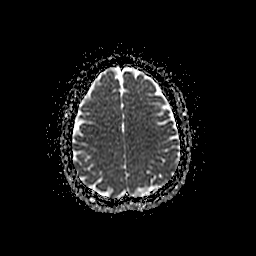
[im 45/45]
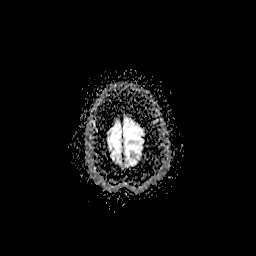

[Series 400: DWI · coronal · 5.0mm · 1.09mm/px · 3 of 29 slices shown (4 of 4)]
[im 1/29]
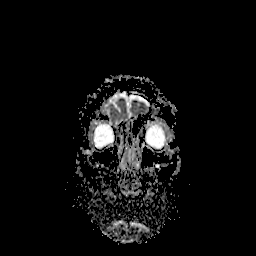
[im 15/29]
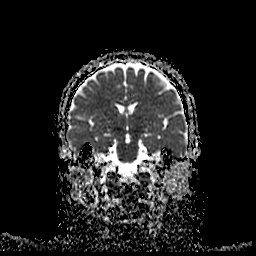
[im 29/29]
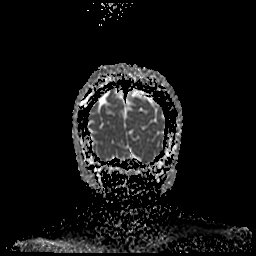

[35 of 48 positions shown; findings below may reference images not displayed]

FINDINGS: Brain: There is no evidence of acute infarct, intracranial
hemorrhage, mass, midline shift, or extra-axial fluid collection.
There is a small chronic left parietal lobe cortical infarct. The
brain is normal in signal elsewhere.

Vascular: Major intracranial vascular flow voids are preserved.

Skull and upper cervical spine: Unremarkable bone marrow signal.

Sinuses/Orbits: Unremarkable orbits. Paranasal sinuses and mastoid
air cells are clear.

Other: None.
IMPRESSION: 1. No acute intracranial abnormality.
2. Small chronic left parietal infarct.
